# Patient Record
Sex: Male | Born: 1995 | Race: Black or African American | Hispanic: No | Marital: Single | Smoking: Current every day smoker
Health system: Southern US, Community
[De-identification: ages and names within clinical notes are randomized; demographics above are authoritative.]

## PROBLEM LIST (undated history)

## (undated) DIAGNOSIS — D571 Sickle-cell disease without crisis: Secondary | ICD-10-CM

---

## 2017-09-20 ENCOUNTER — Emergency Department (HOSPITAL_COMMUNITY)
Admission: EM | Admit: 2017-09-20 | Discharge: 2017-09-20 | Disposition: A | Discharge status: Home / Self Care | Payer: Self-pay | Attending: Emergency Medicine | Admitting: Emergency Medicine

## 2017-09-20 ENCOUNTER — Emergency Department (HOSPITAL_COMMUNITY): Payer: Self-pay

## 2017-09-20 ENCOUNTER — Encounter (HOSPITAL_COMMUNITY): Payer: Self-pay | Admitting: Emergency Medicine

## 2017-09-20 DIAGNOSIS — R112 Nausea with vomiting, unspecified: Secondary | ICD-10-CM | POA: Insufficient documentation

## 2017-09-20 DIAGNOSIS — R1013 Epigastric pain: Secondary | ICD-10-CM | POA: Insufficient documentation

## 2017-09-20 DIAGNOSIS — E860 Dehydration: Secondary | ICD-10-CM | POA: Insufficient documentation

## 2017-09-20 DIAGNOSIS — R52 Pain, unspecified: Secondary | ICD-10-CM

## 2017-09-20 DIAGNOSIS — D5700 Hb-SS disease with crisis, unspecified: Secondary | ICD-10-CM | POA: Insufficient documentation

## 2017-09-20 DIAGNOSIS — B349 Viral infection, unspecified: Secondary | ICD-10-CM | POA: Insufficient documentation

## 2017-09-20 HISTORY — DX: Sickle-cell disease without crisis: D57.1

## 2017-09-20 LAB — URINALYSIS, ROUTINE W REFLEX MICROSCOPIC
Bilirubin Urine: NEGATIVE
GLUCOSE, UA: NEGATIVE mg/dL
Hgb urine dipstick: NEGATIVE
KETONES UR: 80 mg/dL — AB
LEUKOCYTES UA: NEGATIVE
Nitrite: NEGATIVE
PH: 5 (ref 5.0–8.0)
Protein, ur: NEGATIVE mg/dL
Specific Gravity, Urine: 1.013 (ref 1.005–1.030)

## 2017-09-20 LAB — COMPREHENSIVE METABOLIC PANEL
ALT: 17 U/L (ref 17–63)
AST: 31 U/L (ref 15–41)
Albumin: 5.3 g/dL — ABNORMAL HIGH (ref 3.5–5.0)
Alkaline Phosphatase: 62 U/L (ref 38–126)
Anion gap: 17 — ABNORMAL HIGH (ref 5–15)
BUN: 21 mg/dL — AB (ref 6–20)
CHLORIDE: 99 mmol/L — AB (ref 101–111)
CO2: 20 mmol/L — AB (ref 22–32)
Calcium: 9.8 mg/dL (ref 8.9–10.3)
Creatinine, Ser: 1.04 mg/dL (ref 0.61–1.24)
GFR calc Af Amer: >60 mL/min (ref >60)
Glucose, Bld: 74 mg/dL (ref 65–99)
POTASSIUM: 4.6 mmol/L (ref 3.5–5.1)
SODIUM: 136 mmol/L (ref 135–145)
Total Bilirubin: 2.8 mg/dL — ABNORMAL HIGH (ref 0.3–1.2)
Total Protein: 8.9 g/dL — ABNORMAL HIGH (ref 6.5–8.1)

## 2017-09-20 LAB — RETICULOCYTES
RBC.: 4.79 MIL/uL (ref 4.22–5.81)
RETIC COUNT ABSOLUTE: 277.8 10*3/uL — AB (ref 19.0–186.0)
RETIC CT PCT: 5.8 % — AB (ref 0.4–3.1)

## 2017-09-20 LAB — CBC
HEMATOCRIT: 39.3 % (ref 39.0–52.0)
Hemoglobin: 14.8 g/dL (ref 13.0–17.0)
MCH: 30.9 pg (ref 26.0–34.0)
MCHC: >37 g/dL — ABNORMAL HIGH (ref 30.0–36.0)
MCV: 82 fL (ref 78.0–100.0)
Platelets: 468 10*3/uL — ABNORMAL HIGH (ref 150–400)
RBC: 4.79 MIL/uL (ref 4.22–5.81)
RDW: 15.9 % — AB (ref 11.5–15.5)
WBC: 11.4 10*3/uL — AB (ref 4.0–10.5)

## 2017-09-20 LAB — I-STAT CG4 LACTIC ACID, ED: LACTIC ACID, VENOUS: 1.8 mmol/L (ref 0.5–1.9)

## 2017-09-20 LAB — LIPASE, BLOOD: LIPASE: 28 U/L (ref 11–51)

## 2017-09-20 LAB — I-STAT TROPONIN, ED: Troponin i, poc: 0 ng/mL (ref 0.00–0.08)

## 2017-09-20 MED ORDER — ONDANSETRON 4 MG PO TBDP: ORAL_TABLET | ORAL | 0 refills | Status: AC

## 2017-09-20 MED ORDER — KETOROLAC TROMETHAMINE 30 MG/ML IJ SOLN
30.0000 mg | Freq: Once | INTRAMUSCULAR | Status: AC
  Administered 2017-09-20: 30 mg via INTRAVENOUS
  Filled 2017-09-20: qty 1

## 2017-09-20 MED ORDER — SODIUM CHLORIDE 0.45 % IV BOLUS
1000.0000 mL | Freq: Once | INTRAVENOUS | Status: AC
  Administered 2017-09-20: 1000 mL via INTRAVENOUS

## 2017-09-20 MED ORDER — ONDANSETRON HCL 4 MG/2ML IJ SOLN
4.0000 mg | Freq: Once | INTRAMUSCULAR | Status: AC
  Administered 2017-09-20: 4 mg via INTRAVENOUS
  Filled 2017-09-20: qty 2

## 2017-09-20 MED ORDER — GI COCKTAIL ~~LOC~~
30.0000 mL | Freq: Once | ORAL | Status: AC
  Administered 2017-09-20: 30 mL via ORAL
  Filled 2017-09-20: qty 30

## 2017-09-20 MED ORDER — FAMOTIDINE 20 MG PO TABS: 20.0000 mg | ORAL_TABLET | Freq: Two times a day (BID) | ORAL | 0 refills | Status: AC

## 2017-09-20 NOTE — ED Notes (Signed)
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 From [place]. Reports being incarcerated for 2 years, moved here to be closer to family. Denies home meds.

## 2017-09-20 NOTE — ED Triage Notes (Signed)
Patient here from home via EMS with complaints of generalized body aches, nausea, vomiting, diarrhea x5 hours.

## 2017-09-20 NOTE — ED Notes (Signed)
Pt is asking RN for multiple things:  Pt is requesting an STD check, but reports he was just checked in January.  Pt is requesting HPV vaccine.  Pt is concerned he might be getting chicken pox because he has a spot on his right arm Pt is requesting to be checked for the flu

## 2017-09-20 NOTE — Discharge Instructions (Signed)
Evaluation today is overall reassuring, I feel your pain was likely brought on by dehydration, please make sure you are drinking plenty of fluids at home.  You may use Tylenol as needed for pain, Zofran for nausea, please begin taking Pepcid twice daily to help with stomach discomfort.  If you develop fevers or chills, chest pain, shortness of breath, cough, persistent nausea or vomiting despite Zofran or worsening pain please return to the ED, otherwise please call to establish care with the sickle cell clinic for continued management and follow-up.

## 2017-09-20 NOTE — ED Notes (Signed)
Bed: WA01 Expected date:  Expected time:  Means of arrival:  Comments: 

## 2017-09-20 NOTE — ED Provider Notes (Signed)
[facility]-EMERGENCY DEPT Provider Note   CSN: [id number] Arrival date & time: 09/20/17  0849     History   Chief Complaint Chief Complaint  Patient presents with  . Generalized Body Aches  . Nausea  . Emesis    HPI Bobby Lutz is a 22 y.o. male.  Bobby Lutz is a 22 y.o. 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  Patient reports he rarely has crises with his sickle cell, recently moved to the area and established with anyone for sickle cell management.  Patient reports last night around midnight he started having vomiting, has vomited approximately 8 times, nonbloody and nonbilious.  Patient also complains of some epigastric abdominal discomfort which comes and goes.  Patient denies diarrhea, constipation or blood in the stool.  And also reports he feels achy all over, primarily over his back, with some mild pain shooting up to the chest.  No meds prior to arrival to treat his symptoms.  Patient denies any fevers or chills, no cough, no upper respiratory symptoms.  No headaches or vision changes.  Reports his joints in general feels stiff, denies any redness, warmth or swelling of the joints.      Past Medical History:  Diagnosis Date  . Sickle cell anemia (HCC)     There are no active problems to display for this patient.   History reviewed. No pertinent surgical history.     Home Medications    Prior to Admission medications   Medication Sig Start Date End Date Taking? Authorizing Provider  folic acid (FOLVITE) 1 MG tablet Take 1 mg by mouth daily.   Yes [provider]  Multiple Vitamins-Minerals (MULTIVITAMIN WITH MINERALS) tablet Take 1 tablet by mouth daily.   Yes [provider]    Family History No family history on file.  Social  History Social History   Tobacco Use  . Smoking status: Never Smoker  . Smokeless tobacco: Never Used  Substance Use Topics  . Alcohol use: Not on file  . Drug use: Not on file     Allergies   Rocephin [ceftriaxone sodium in dextrose]   Review of Systems Review of Systems  Constitutional: Negative for chills and fever.  HENT: Negative for congestion, rhinorrhea and sore throat.   Eyes: Negative for visual disturbance.  Respiratory: Negative for choking, chest tightness, shortness of breath and wheezing.   Cardiovascular: Positive for chest pain. Negative for palpitations and leg swelling.  Gastrointestinal: Positive for abdominal pain, nausea and vomiting. Negative for blood in stool and diarrhea.  Genitourinary: Negative for dysuria.  Musculoskeletal: Positive for arthralgias, back pain and myalgias. Negative for joint swelling, neck pain and neck stiffness.  Skin: Negative for color change and rash.  Neurological: Negative for dizziness, weakness, light-headedness and headaches.     Physical Exam Updated Vital Signs BP 124/74 (BP Location: Right Arm)   Pulse 96   Temp 98.1 F (36.7 C) (Oral)   Resp 16   Ht 6\' 1"  (1.854 m)   Wt 72.6 kg (160 lb)   BMI 21.11 kg/m   Physical Exam  Constitutional: He appears well-developed and well-nourished. No distress.  HENT:  Head: Normocephalic and atraumatic.  Mouth/Throat: Oropharynx is clear and moist.  Eyes: Right eye exhibits no discharge. Left eye exhibits no discharge.  Neck: Neck supple.  Cardiovascular: Normal rate, regular rhythm and normal heart sounds.  Pulmonary/Chest: Effort normal and breath sounds normal. No stridor. No respiratory distress. He has no wheezes. He has no rales.  Respirations equal and unlabored, patient able to speak in full sentences, lungs clear to auscultation bilaterally  Abdominal: Soft. Bowel sounds are normal. He exhibits no distension and no mass. There is tenderness. There is no guarding.   Mild epigastric tenderness without guarding, all other quadrants nontender to palpation  Musculoskeletal: He exhibits no edema or deformity.  All joints are supple and easily movable, mild tenderness throughout the back, no focal midline spinal tenderness  Neurological: He is alert. Coordination normal.  Skin: Skin is warm and dry. Capillary refill takes less than 2 seconds. He is not diaphoretic.  Psychiatric: He has a normal mood and affect. His behavior is normal.  Nursing note and vitals reviewed.    ED Treatments / Results  Labs (all labs ordered are listed, but only abnormal results are displayed) Labs Reviewed  COMPREHENSIVE METABOLIC PANEL - Abnormal; Notable for the following components:      Result Value   Chloride 99 (*)    CO2 20 (*)    BUN 21 (*)    Total Protein 8.9 (*)    Albumin 5.3 (*)    Total Bilirubin 2.8 (*)    Anion gap 17 (*)    All other components within normal limits  CBC - Abnormal; Notable for the following components:   WBC 11.4 (*)    MCHC >37.0 (*)    RDW 15.9 (*)    Platelets 468 (*)    All other components within normal limits  URINALYSIS, ROUTINE W REFLEX MICROSCOPIC - Abnormal; Notable for the following components:   Ketones, ur 80 (*)    All other components within normal limits  RETICULOCYTES - Abnormal; Notable for the following components:   Retic Ct Pct 5.8 (*)    Retic Count, Absolute 277.8 (*)    All other components within normal limits  LIPASE, BLOOD  I-STAT TROPONIN, ED  I-STAT CG4 LACTIC ACID, ED    EKG  EKG Interpretation  Date/Time:  Sunday September 20 2017 12:16:41 EST Ventricular Rate:  100 PR Interval:    QRS Duration: 84 QT Interval:  334 QTC Calculation: 431 R Axis:   103 Text Interpretation:  Sinus tachycardia Consider left atrial enlargement Borderline right axis deviation Borderline ST elevation, anterolateral leads Confirmed by Donnetta Hutchingook, Brian ([id number]) on 09/20/2017 3:00:26 PM       Radiology Dg Chest 2  View  Result Date: 09/20/2017 CLINICAL DATA:  Generalized body aches.  Nausea and vomiting. EXAM: CHEST  2 VIEW COMPARISON:  None. FINDINGS: The heart size and mediastinal contours are within normal limits. Both lungs are clear. The visualized skeletal structures are unremarkable. IMPRESSION: No active cardiopulmonary disease. Electronically Signed   By: Gerome Samavid  Williams III M.D   On: 09/20/2017 12:02    Procedures Procedures (including critical care time)  Medications Ordered in ED Medications  sodium chloride 0.45 % bolus 1,000 mL (0 mLs Intravenous Stopped 09/20/17 1421)  ketorolac (TORADOL) 30 MG/ML injection 30 mg (30 mg Intravenous Given 09/20/17 1201)  ondansetron (ZOFRAN) injection 4 mg (4 mg Intravenous Given 09/20/17 1201)  gi cocktail (Maalox,Lidocaine,Donnatal) (30 mLs Oral Given 09/20/17 1420)  sodium chloride 0.45 % bolus 1,000 mL (1,000 mLs Intravenous New Bag/Given 09/20/17 1420)     Initial Impression / Assessment and Plan / ED Course  I have reviewed the triage  vital signs and the nursing notes.  Pertinent labs & imaging results that were available during my care of the patient were reviewed by me and considered in my medical decision making (see chart for details).  She presents for evaluation of general, nausea, vomiting and epigastric abdominal pain, complaining of back pain and some radiation of pain to the chest, no pleuritic pain.  Patient with history of sickle cell, but rarely has crises and is not on chronic pain medication.  On initial evaluation vitals normal and patient is overall well-appearing, does appear dry on exam.  Will get abdominal labs, reticulocyte count, troponin and lactic acid, as well as EKG and chest x-ray. 2L Fluid bolus, zofran, toradol and GI cocktail.  Abdominal exam with minimal epigastric tenderness, not concerning for acute surgical abdomen, do not feel imaging is necessary at this time.  Troponin neg, chest x-ray shows no evidence of  infiltrates or other active cardiopulmonary disease, EKG without significant ischemic changes, low suspicion for acute chest syndrome.  Labs otherwise reassuring, white count of 11.4, hemoglobin is normal, good retic count, BUN is slightly elevated, but kidney function normal, no acute electrolyte derangements requiring intervention.  UA without evidence of infection, ED ketones present further supporting dehydration.  On reevaluation after fluids and medications patient reports he is feeling much better, denies any abdominal pain has had no further episodes of emesis and is tolerating p.o. and food here in the ED.  On exam no longer experiencing epigastric tenderness.  Patient is mobile and ambulatory without pain.  Even reassuring labs and resolution of symptoms I feel patient is stable for discharge at this time encourage patient to drink plenty of fluids, Zofran and Pepcid for vomiting and epigastric pain.  Patient to follow-up with the sickle cell clinic to establish care.  Strict return precautions discussed.  Patient expresses understanding and is in agreement with plan.  Patient discussed with Dr. Adriana Simas who is in agreement with plan.   Final Clinical Impressions(s) / ED Diagnoses   Final diagnoses:  Generalized body aches  Viral illness  Non-intractable vomiting with nausea, unspecified vomiting type  Sickle cell anemia with pain (HCC)  Epigastric pain  Dehydration    ED Discharge Orders        Ordered    ondansetron (ZOFRAN ODT) 4 MG disintegrating tablet     09/20/17 1601    famotidine (PEPCID) 20 MG tablet  2 times daily     09/20/17 1601       Legrand Rams 09/20/17 1659    Donnetta Hutching, MD 09/23/17 [phone]

## 2017-11-08 ENCOUNTER — Emergency Department (HOSPITAL_COMMUNITY)
Admission: EM | Admit: 2017-11-08 | Discharge: 2017-11-08 | Disposition: A | Discharge status: Home / Self Care | Payer: Self-pay | Attending: Emergency Medicine | Admitting: Emergency Medicine

## 2017-11-08 ENCOUNTER — Encounter (HOSPITAL_COMMUNITY): Payer: Self-pay | Admitting: Emergency Medicine

## 2017-11-08 ENCOUNTER — Emergency Department (HOSPITAL_COMMUNITY): Payer: Self-pay

## 2017-11-08 DIAGNOSIS — S29011A Strain of muscle and tendon of front wall of thorax, initial encounter: Secondary | ICD-10-CM | POA: Insufficient documentation

## 2017-11-08 DIAGNOSIS — Y998 Other external cause status: Secondary | ICD-10-CM | POA: Insufficient documentation

## 2017-11-08 DIAGNOSIS — W109XXA Fall (on) (from) unspecified stairs and steps, initial encounter: Secondary | ICD-10-CM | POA: Insufficient documentation

## 2017-11-08 DIAGNOSIS — Y929 Unspecified place or not applicable: Secondary | ICD-10-CM | POA: Insufficient documentation

## 2017-11-08 DIAGNOSIS — D571 Sickle-cell disease without crisis: Secondary | ICD-10-CM | POA: Insufficient documentation

## 2017-11-08 DIAGNOSIS — Y939 Activity, unspecified: Secondary | ICD-10-CM | POA: Insufficient documentation

## 2017-11-08 DIAGNOSIS — F17210 Nicotine dependence, cigarettes, uncomplicated: Secondary | ICD-10-CM | POA: Insufficient documentation

## 2017-11-08 DIAGNOSIS — Z79899 Other long term (current) drug therapy: Secondary | ICD-10-CM | POA: Insufficient documentation

## 2017-11-08 DIAGNOSIS — T148XXA Other injury of unspecified body region, initial encounter: Secondary | ICD-10-CM

## 2017-11-08 MED ORDER — IBUPROFEN 600 MG PO TABS: 600.0000 mg | ORAL_TABLET | Freq: Three times a day (TID) | ORAL | 0 refills | Status: AC

## 2017-11-08 MED ORDER — METHOCARBAMOL 500 MG PO TABS: 500.0000 mg | ORAL_TABLET | Freq: Every evening | ORAL | 0 refills | Status: AC | PRN

## 2017-11-08 NOTE — Discharge Instructions (Signed)
Take ibuprofen 3 times a day with meals.  Do not take other anti-inflammatories at the same time open (Advil, Motrin, naproxen, Aleve). You may supplement with Tylenol if you need further pain control. You may take robaxin as needed for continued muscle pain. Have caution, as this may make your tired or groggy. Do not drive or operate heavy machinery while taking this medication.  Use ice packs or heating pads if this helps control your pain. Try to continue to take deep breaths in, even though it hurts, to prevent infection. Return to the ER if you develop fevers, cough, blood in your urine, difficulty breathing, or any new or concerning symptoms.

## 2017-11-08 NOTE — ED Provider Notes (Signed)
[facility]-EMERGENCY DEPT Provider Note   CSN: [id number] Arrival date & time: 11/08/17  1845     History   Chief Complaint Chief Complaint  Patient presents with  . left rib pain    HPI Bobby Lutz is a 22 y.o. male for evaluation of left-sided rib pain.  Patient states he slipped going down some stairs a week ago.  He denies hitting his head or loss of consciousness.  He reports hitting his back and his left side.  Since then, he has had pain.  Pain is constant, not getting better or worse.  He has not taken anything for pain including Tylenol or ibuprofen.  Nothing makes it better, palpation, deep inspiration, and movement makes it worse.  He states the pain is sharp.  Denies neck pain, numbness, or tingling.  He denies pain elsewhere.  He denies loss of bowel or bladder control.  He is ambulatory without difficulty.  He is not on blood thinners.  He denies fevers, chills, shortness of breath, cough, nausea, vomiting, abdominal pain, hematuria, or abnormal bowel movements.  Has a history of sickle cell, no other medical problems.  Does not take medications daily.  HPI  Past Medical History:  Diagnosis Date  . Sickle cell anemia (HCC)     There are no active problems to display for this patient.   History reviewed. No pertinent surgical history.      Home Medications    Prior to Admission medications   Medication Sig Start Date End Date Taking? Authorizing Provider  famotidine (PEPCID) 20 MG tablet Take 1 tablet (20 mg total) by mouth 2 (two) times daily. 09/20/17   Dartha LodgeFord, Kelsey N, PA-C  folic acid (FOLVITE) 1 MG tablet Take 1 mg by mouth daily.    [provider]  ibuprofen (ADVIL,MOTRIN) 600 MG tablet Take 1 tablet (600 mg total) by mouth 3 (three) times daily with meals. 11/08/17   Aldridge Krzyzanowski, PA-C  methocarbamol (ROBAXIN) 500 MG tablet Take 1 tablet (500 mg total) by mouth at bedtime as needed for muscle spasms. 11/08/17    Juventino Pavone, PA-C  Multiple Vitamins-Minerals (MULTIVITAMIN WITH MINERALS) tablet Take 1 tablet by mouth daily.    [provider]  ondansetron (ZOFRAN ODT) 4 MG disintegrating tablet 4mg  ODT q4 hours prn nausea/vomit 09/20/17   Dartha LodgeFord, Kelsey N, PA-C    Family History No family history on file.  Social History Social History   Tobacco Use  . Smoking status: Current Every Day Smoker    Types: Cigarettes  . Smokeless tobacco: Never Used  Substance Use Topics  . Alcohol use: Yes  . Drug use: Not on file     Allergies   Rocephin [ceftriaxone sodium in dextrose]   Review of Systems Review of Systems  Cardiovascular: Positive for chest pain.  Musculoskeletal: Positive for back pain.  Neurological: Negative for numbness.  Hematological: Does not bruise/bleed easily.     Physical Exam Updated Vital Signs BP 131/79 (BP Location: Left Arm)   Pulse 68   Temp 99.4 F (37.4 C) (Oral)   Resp 14   Ht 6' (1.829 m)   Wt 73 kg (161 lb)   SpO2 99%   BMI 21.84 kg/m   Physical Exam  Constitutional: He is oriented to person, place, and time. He appears well-developed and well-nourished. No distress.  Sitting comfortably in bed in no apparent distress  HENT:  Head: Normocephalic and atraumatic.  No obvious injury to the head.  Eyes:  Pupils are equal, round, and reactive to light. Conjunctivae and EOM are normal.  Neck: Normal range of motion.  No tenderness palpation of midline C-spine.  No step-offs.  Full active range of motion of the head without pain  Cardiovascular: Normal rate, regular rhythm and intact distal pulses.  Pulmonary/Chest: Effort normal and breath sounds normal. No respiratory distress. He has no wheezes. He exhibits tenderness.  Tenderness to palpation of left-sided anterior lateral chest wall and costochondral areas.  No obvious deformity or flail chest.  Patient moving his left arm over his head throughout exam and history without signs of pain.  Lung sounds clear in all fields.  Speaking in full sentences without difficulty.   Abdominal: Soft. He exhibits no distension. There is no tenderness.  Musculoskeletal: Normal range of motion. He exhibits tenderness. He exhibits no edema or deformity.  Tenderness to palpation of low back musculature without increased pain over midline spine.  No step-offs.  Patient is ambulatory.  Strength intact x4.  Sensation intact x4.  No obvious deformity.    Neurological: He is alert and oriented to person, place, and time. No sensory deficit.  Skin: Skin is warm. No rash noted.  Psychiatric: He has a normal mood and affect.  Nursing note and vitals reviewed.    ED Treatments / Results  Labs (all labs ordered are listed, but only abnormal results are displayed) Labs Reviewed - No data to display  EKG None  Radiology Dg Ribs Unilateral W/chest Left  Result Date: 11/08/2017 CLINICAL DATA:  Larey Seat downstairs EXAM: LEFT RIBS AND CHEST - 3+ VIEW COMPARISON:  09/20/2017 FINDINGS: Single-view chest demonstrates no acute consolidation or effusion. Normal heart size. No pneumothorax. Left rib series demonstrates no definite acute displaced left rib fracture. IMPRESSION: Negative. Electronically Signed   By: Jasmine Pang M.D.   On: 11/08/2017 19:51    Procedures Procedures (including critical care time)  Medications Ordered in ED Medications - No data to display   Initial Impression / Assessment and Plan / ED Course  I have reviewed the triage vital signs and the nursing notes.  Pertinent labs & imaging results that were available during my care of the patient were reviewed by me and considered in my medical decision making (see chart for details).     Patient presenting for evaluation of left-sided chest pain and low back pain after a fall a week ago.  Physical exam reassuring, he is neurovascularly intact.  X-ray reviewed and interpreted by me, shows no sign of fracture or pneumothorax.  No  pneumonia.  Pain is reproducible on exam, likely musculoskeletal.  Discussed with patient.  Discussed treatment with NSAIDs and muscle relaxer.  Doubt cardiac cause of pain.  Doubt PE.  Doubt vertebral injury, infection, spinal cord compression, myelopathy, or cauda equina syndrome.  Discussed case with attending, Dr. Freida Busman agrees to plan.  At this time, patient appears safe for discharge.  Return precautions given.  Patient states he understands and agrees to plan.  Final Clinical Impressions(s) / ED Diagnoses   Final diagnoses:  Muscle strain    ED Discharge Orders        Ordered    ibuprofen (ADVIL,MOTRIN) 600 MG tablet  3 times daily with meals     11/08/17 2101    methocarbamol (ROBAXIN) 500 MG tablet  At bedtime PRN     11/08/17 2101       Jasminne Mealy, PA-C 11/08/17 2122    Lorre Nick, MD 11/08/17 2311
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
## 2018-07-14 ENCOUNTER — Emergency Department (HOSPITAL_COMMUNITY): Admission: EM | Admit: 2018-07-14 | Discharge: 2018-07-14 | Discharge status: Against Medical Advice | Payer: Self-pay

## 2018-09-04 IMAGING — CR DG RIBS W/ CHEST 3+V*L*
4 series · 4 of 4 positions shown · non-contrast
Comparison: 09/20/2017

CLINICAL DATA: Fell downstairs

EXAM:
LEFT RIBS AND CHEST - 3+ VIEW

[w chest pa]
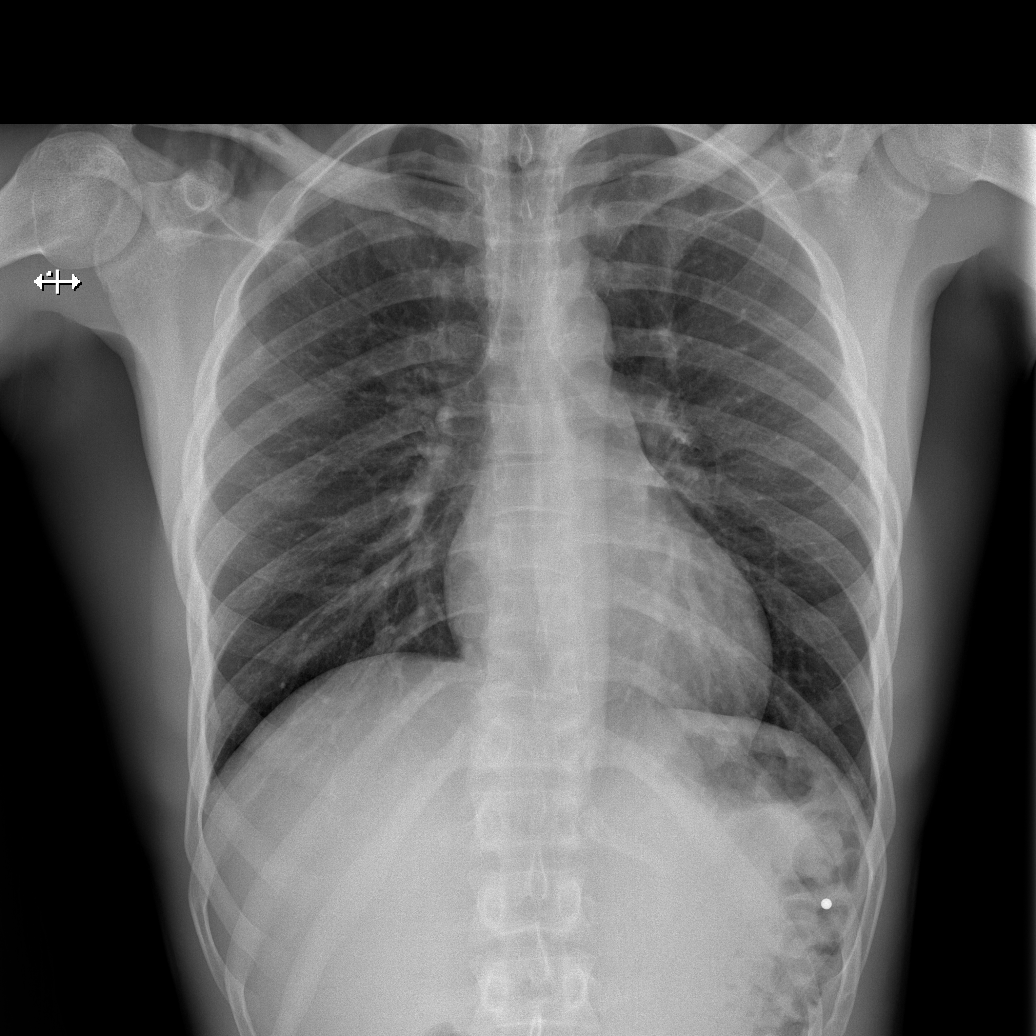

[w ribs ap upper left]
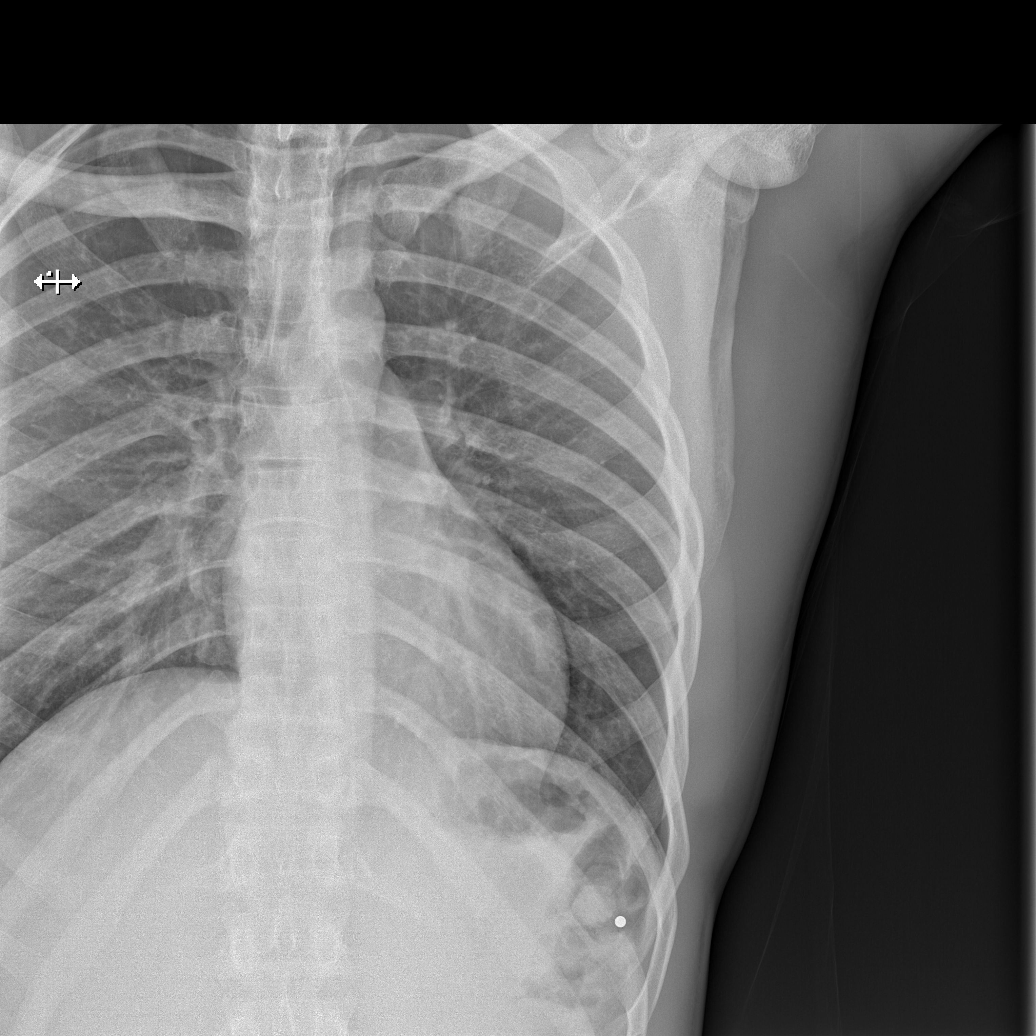

[w ribs ap lower left]
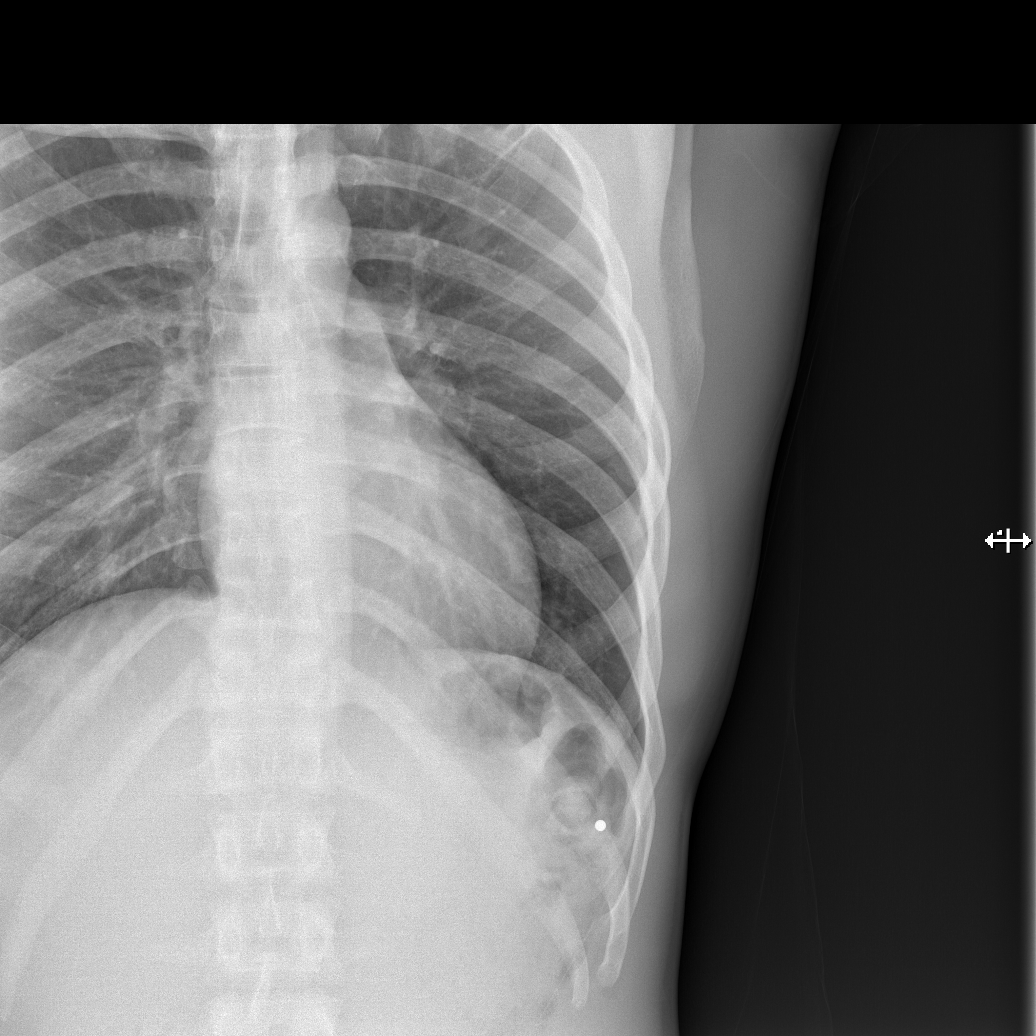

[w ribs obl left]
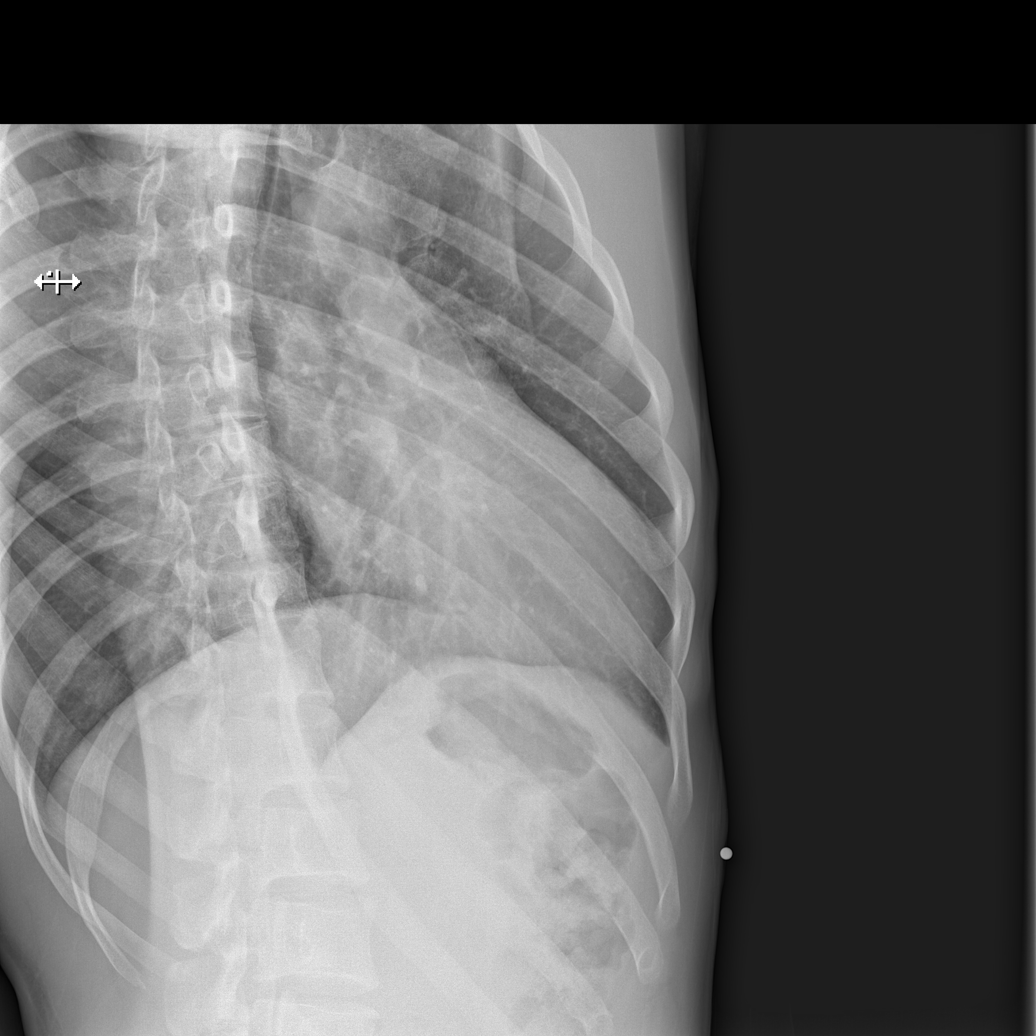

[4 of 4 positions shown; findings below may reference images not displayed]

FINDINGS: Single-view chest demonstrates no acute consolidation or effusion.
Normal heart size. No pneumothorax.

Left rib series demonstrates no definite acute displaced left rib
fracture.
IMPRESSION: Negative.
